# Patient Record
Sex: Male | Born: 1975 | Race: White | Hispanic: No | Marital: Single | Smoking: Current every day smoker
Health system: Southern US, Community
[De-identification: ages and names within clinical notes are randomized; demographics above are authoritative.]

## PROBLEM LIST (undated history)

## (undated) DIAGNOSIS — J45909 Unspecified asthma, uncomplicated: Secondary | ICD-10-CM

## (undated) HISTORY — PX: LEG SURGERY: SHX1003

---

## 2009-04-21 DEATH — deceased

## 2010-11-16 ENCOUNTER — Emergency Department (HOSPITAL_COMMUNITY)
Admission: EM | Admit: 2010-11-16 | Discharge: 2010-11-17 | Disposition: A | Discharge status: Home / Self Care | Payer: Self-pay | Attending: Emergency Medicine | Admitting: Emergency Medicine

## 2010-11-16 DIAGNOSIS — L2989 Other pruritus: Secondary | ICD-10-CM | POA: Insufficient documentation

## 2010-11-16 DIAGNOSIS — R070 Pain in throat: Secondary | ICD-10-CM | POA: Insufficient documentation

## 2010-11-16 DIAGNOSIS — L298 Other pruritus: Secondary | ICD-10-CM | POA: Insufficient documentation

## 2010-11-16 DIAGNOSIS — L259 Unspecified contact dermatitis, unspecified cause: Secondary | ICD-10-CM | POA: Insufficient documentation

## 2010-11-16 DIAGNOSIS — R21 Rash and other nonspecific skin eruption: Secondary | ICD-10-CM | POA: Insufficient documentation

## 2015-11-22 ENCOUNTER — Ambulatory Visit (HOSPITAL_COMMUNITY)
Admission: EM | Admit: 2015-11-22 | Discharge: 2015-11-22 | Disposition: A | Discharge status: Home / Self Care | Payer: Medicare Other | Attending: Family Medicine | Admitting: Family Medicine

## 2015-11-22 ENCOUNTER — Encounter (HOSPITAL_COMMUNITY): Payer: Self-pay | Admitting: *Deleted

## 2015-11-22 DIAGNOSIS — B86 Scabies: Secondary | ICD-10-CM | POA: Diagnosis not present

## 2015-11-22 HISTORY — DX: Unspecified asthma, uncomplicated: J45.909

## 2015-11-22 MED ORDER — PERMETHRIN 5 % EX CREA: TOPICAL_CREAM | CUTANEOUS | 1 refills | Status: DC

## 2015-11-22 NOTE — ED Triage Notes (Signed)
Pt   Reports   Symptoms  Of  Rash     X  3   Weeks        The  Rash itches   pts  Wife  And  Children are  With  Him  At this  Time     He is  Somewhat  Anxious

## 2015-11-22 NOTE — ED Provider Notes (Signed)
[facility]    CSN: [id number] Arrival date & time: 11/22/15  1746  First Provider Contact:  First MD Initiated Contact with Patient 11/22/15 1812        History   Chief Complaint Chief Complaint  Patient presents with  . Rash    HPI Brian Zhang is a 40 y.o. male.   The history is provided by the patient.  Rash  Location:  Full body Quality: itchiness   Severity:  Moderate Duration:  3 weeks Progression:  Spreading (pt extremely agitated about problem.) Chronicity:  New Context: animal contact   Context: not sick contacts   Relieved by:  None tried Worsened by:  Nothing Ineffective treatments:  None tried Associated symptoms: no abdominal pain and no fever     Past Medical History:  Diagnosis Date  . Asthma     There are no active problems to display for this patient.   History reviewed. No pertinent surgical history.     Home Medications    Prior to Admission medications   Medication Sig Start Date End Date Taking? Authorizing Provider  Atomoxetine HCl (STRATTERA PO) Take by mouth.   Yes Historical Provider, MD  DIAZEPAM PO Take by mouth.   Yes Historical Provider, MD  Venlafaxine HCl (EFFEXOR XR PO) Take by mouth.   Yes Historical Provider, MD  permethrin (ELIMITE) 5 % cream Apply tonight over body and wash off in am, may repeat in 1 week 11/22/15   Linna HoffJames D Geraldy Akridge, MD    Family History History reviewed. No pertinent family history.  Social History Social History  Substance Use Topics  . Smoking status: Current Every Day Smoker  . Smokeless tobacco: Never Used  . Alcohol use Not on file     Allergies   Review of patient's allergies indicates not on file.   Review of Systems Review of Systems  Constitutional: Negative for fever.  Gastrointestinal: Negative for abdominal pain.  Skin: Positive for rash.  All other systems reviewed and are negative.    Physical Exam Triage Vital Signs ED Triage Vitals [11/22/15 1815]  Enc  Vitals Group     BP 126/74     Pulse Rate 98     Resp 18     Temp 98.6 F (37 C)     Temp Source Oral     SpO2 100 %     Weight      Height      Head Circumference      Peak Flow      Pain Score      Pain Loc      Pain Edu?      Excl. in GC?    No data found.   Updated Vital Signs BP 126/74 (BP Location: Right Arm)   Pulse 98   Temp 98.6 F (37 C) (Oral)   Resp 18   SpO2 100%   Visual Acuity Right Eye Distance:   Left Eye Distance:   Bilateral Distance:    Right Eye Near:   Left Eye Near:    Bilateral Near:     Physical Exam  Constitutional: He appears well-developed and well-nourished. He appears distressed.  Neck: Normal range of motion. Neck supple.  Pulmonary/Chest: Effort normal and breath sounds normal.  Lymphadenopathy:    He has no cervical adenopathy.  Neurological: He is alert.  Skin: Skin is warm and dry. Rash noted.  Scattered papular rash, pt restless, agitated.  Nursing note and vitals reviewed.  UC Treatments / Results  Labs (all labs ordered are listed, but only abnormal results are displayed) Labs Reviewed - No data to display  EKG  EKG Interpretation None       Radiology No results found.  Procedures Procedures (including critical care time)  Medications Ordered in UC Medications - No data to display   Initial Impression / Assessment and Plan / UC Course  I have reviewed the triage vital signs and the nursing notes.  Pertinent labs & imaging results that were available during my care of the patient were reviewed by me and considered in my medical decision making (see chart for details).  Clinical Course      Final Clinical Impressions(s) / UC Diagnoses   Final diagnoses:  Scabies    New Prescriptions New Prescriptions   PERMETHRIN (ELIMITE) 5 % CREAM    Apply tonight over body and wash off in am, may repeat in 1 week     Linna Hoff, MD 11/22/15 [phone]

## 2016-04-13 ENCOUNTER — Emergency Department (HOSPITAL_BASED_OUTPATIENT_CLINIC_OR_DEPARTMENT_OTHER): Payer: Medicare Other

## 2016-04-13 ENCOUNTER — Encounter (HOSPITAL_BASED_OUTPATIENT_CLINIC_OR_DEPARTMENT_OTHER): Payer: Self-pay

## 2016-04-13 ENCOUNTER — Emergency Department (HOSPITAL_BASED_OUTPATIENT_CLINIC_OR_DEPARTMENT_OTHER)
Admission: EM | Admit: 2016-04-13 | Discharge: 2016-04-13 | Disposition: A | Discharge status: Home / Self Care | Payer: Medicare Other | Attending: Emergency Medicine | Admitting: Emergency Medicine

## 2016-04-13 DIAGNOSIS — M722 Plantar fascial fibromatosis: Secondary | ICD-10-CM

## 2016-04-13 DIAGNOSIS — J45909 Unspecified asthma, uncomplicated: Secondary | ICD-10-CM | POA: Insufficient documentation

## 2016-04-13 DIAGNOSIS — F17210 Nicotine dependence, cigarettes, uncomplicated: Secondary | ICD-10-CM | POA: Diagnosis not present

## 2016-04-13 DIAGNOSIS — M79672 Pain in left foot: Secondary | ICD-10-CM | POA: Diagnosis present

## 2016-04-13 MED ORDER — IBUPROFEN 800 MG PO TABS: 800.0000 mg | ORAL_TABLET | Freq: Three times a day (TID) | ORAL | 0 refills | Status: DC

## 2016-04-13 NOTE — Discharge Instructions (Signed)
Return if any problems.

## 2016-04-13 NOTE — ED Provider Notes (Signed)
[facility]   CSN: [id number] Arrival date & time: 04/13/16  1548  By signing my name below, I, Brian Zhang, attest that this documentation has been prepared under the direction and in the presence of Brian Zhang, [place]. Electronically Signed: Linna Zhang, Scribe. 04/13/2016. 8:07 PM.  History   Chief Complaint Chief Complaint  Patient presents with  . Foot Pain    The history is provided by the patient. No language interpreter was used.     HPI Comments: Brian Zhang is a 41 y.o. male who presents to the Emergency Department complaining of severe pain to the plantar surface of his left foot with applied pressure beginning a few days ago. He denies pain at rest. He states he has "amost fallen" several times when he bears weight on his left foot secondary to pain. No h/o left foot problems. He works in [employer]. He denies numbness/tingling or any other associated symptoms.  Past Medical History:  Diagnosis Date  . Asthma     There are no active problems to display for this patient.   Past Surgical History:  Procedure Laterality Date  . LEG SURGERY         Home Medications    Prior to Admission medications   Medication Sig Start Date End Date Taking? Authorizing Provider  Atomoxetine HCl (STRATTERA PO) Take by mouth.    Historical Provider, MD    Family History No family history on file.  Social History Social History  Substance Use Topics  . Smoking status: Current Every Day Smoker    Types: Cigarettes  . Smokeless tobacco: Never Used  . Alcohol use No     Allergies   Patient has no allergy information on record.   Review of Systems Review of Systems  Musculoskeletal: Positive for myalgias.  Neurological: Negative for numbness.  All other systems reviewed and are negative.    Physical Exam Updated Vital Signs BP 132/98   Pulse 99   Temp 98.2 F (36.8 C) (Oral)   Resp 18   Ht 5\' 9"  (1.753 m)   Wt 171 lb  (77.6 kg)   SpO2 100%   BMI 25.25 kg/m   Physical Exam  Constitutional: He is oriented to person, place, and time. He appears well-developed and well-nourished. No distress.  HENT:  Head: Normocephalic and atraumatic.  Eyes: Conjunctivae and EOM are normal.  Neck: Neck supple. No tracheal deviation present.  Cardiovascular: Normal rate.   Pulmonary/Chest: Effort normal. No respiratory distress.  Musculoskeletal: Normal range of motion.  Normal appearing left foot. No swelling. FROM. Dried, scaly heel.  Neurological: He is alert and oriented to person, place, and time.  Skin: Skin is warm and dry.  Psychiatric: He has a normal mood and affect. His behavior is normal.  Nursing note and vitals reviewed.    ED Treatments / Results  Labs (all labs ordered are listed, but only abnormal results are displayed) Labs Reviewed - No data to display  EKG  EKG Interpretation None       Radiology No results found.  Procedures Procedures (including critical care time)  DIAGNOSTIC STUDIES: Oxygen Saturation is 100% on RA, normal by my interpretation.    COORDINATION OF CARE: 8:12 PM Discussed treatment plan with pt at bedside and pt agreed to plan.  Medications Ordered in ED Medications - No data to display   Initial Impression / Assessment and Plan / ED Course  I have reviewed the triage vital signs and the nursing  notes.  Pertinent labs & imaging results that were available during my care of the patient were reviewed by me and considered in my medical decision making (see chart for details).     Pt counseled on exercises for plantar fascitis. Ibuprofen and heel pads.   Final Clinical Impressions(s) / ED Diagnoses   Final diagnoses:  Plantar fasciitis of left foot    New Prescriptions New Prescriptions   No medications on file   Meds ordered this encounter  Medications  . ibuprofen (ADVIL,MOTRIN) 800 MG tablet    Sig: Take 1 tablet (800 mg total) by mouth 3  (three) times daily.    Dispense:  21 tablet    Refill:  0    Order Specific Question:   Supervising Provider    Answer:   Brian Zhang, BRIAN [[id number]]  An After Visit Summary was printed and given to the patient. I personally performed the services in this documentation, which was scribed in my presence.  The recorded information has been reviewed and considered.   Brian PallKaren SofiaPAC.   Brian SkinnerLeslie K OrmsbySofia, PA-C 04/14/16 [id number]    Brian Raceravid Yelverton, MD 04/15/16 [phone]

## 2016-04-13 NOTE — ED Triage Notes (Signed)
C/o pain to left foot/heel x 6 days-denies injury-NAD-presents to triage in w/c

## 2016-08-14 ENCOUNTER — Encounter (HOSPITAL_COMMUNITY): Payer: Self-pay

## 2016-08-14 ENCOUNTER — Emergency Department (HOSPITAL_COMMUNITY)
Admission: EM | Admit: 2016-08-14 | Discharge: 2016-08-14 | Disposition: A | Discharge status: Home / Self Care | Payer: Medicare Other | Attending: Emergency Medicine | Admitting: Emergency Medicine

## 2016-08-14 DIAGNOSIS — S30860A Insect bite (nonvenomous) of lower back and pelvis, initial encounter: Secondary | ICD-10-CM | POA: Insufficient documentation

## 2016-08-14 DIAGNOSIS — J45909 Unspecified asthma, uncomplicated: Secondary | ICD-10-CM | POA: Diagnosis not present

## 2016-08-14 DIAGNOSIS — Y9261 Building [any] under construction as the place of occurrence of the external cause: Secondary | ICD-10-CM | POA: Insufficient documentation

## 2016-08-14 DIAGNOSIS — W57XXXA Bitten or stung by nonvenomous insect and other nonvenomous arthropods, initial encounter: Secondary | ICD-10-CM | POA: Diagnosis not present

## 2016-08-14 DIAGNOSIS — S20369A Insect bite (nonvenomous) of unspecified front wall of thorax, initial encounter: Secondary | ICD-10-CM | POA: Insufficient documentation

## 2016-08-14 DIAGNOSIS — Y939 Activity, unspecified: Secondary | ICD-10-CM | POA: Insufficient documentation

## 2016-08-14 DIAGNOSIS — Z79899 Other long term (current) drug therapy: Secondary | ICD-10-CM | POA: Insufficient documentation

## 2016-08-14 DIAGNOSIS — F17210 Nicotine dependence, cigarettes, uncomplicated: Secondary | ICD-10-CM | POA: Diagnosis not present

## 2016-08-14 DIAGNOSIS — S80862A Insect bite (nonvenomous), left lower leg, initial encounter: Secondary | ICD-10-CM | POA: Insufficient documentation

## 2016-08-14 DIAGNOSIS — Y990 Civilian activity done for income or pay: Secondary | ICD-10-CM | POA: Insufficient documentation

## 2016-08-14 MED ORDER — IVERMECTIN 3 MG PO TABS: 200.0000 ug/kg | ORAL_TABLET | Freq: Once | ORAL | 0 refills | Status: AC

## 2016-08-14 MED ORDER — HYDROCORTISONE 1 % EX CREA: TOPICAL_CREAM | CUTANEOUS | 0 refills | Status: AC

## 2016-08-14 MED ORDER — PERMETHRIN 5 % EX CREA: TOPICAL_CREAM | CUTANEOUS | 0 refills | Status: AC

## 2016-08-14 NOTE — ED Provider Notes (Signed)
[facility]   CSN: [id number] Arrival date & time: 08/14/16  1531     History   Chief Complaint Chief Complaint  Patient presents with  . bug bites    HPI Brian Zhang is a 41 y.o. male who presents with 3 weeks of of pruritic bites to generalized body. 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 Patient reports that since then he has visualized small black bugs on various parts of his body and has had resulting bites. He reports that he has tried multiple over-the-counter topical creams. He is tried multiple treatments at his house to get rid of the bugs. He denies any improvement of symptoms with these treatments. He reports that despite treatments bites and itching have persisted. Comes today because the itching and discomfort has gotten so severe that he had difficulty sleeping.Patient states that his wife and daughter who live in the same house have not had the same symptoms. He denies any fever, recent illness, difficulty breathing.   The history is provided by the patient.    Past Medical History:  Diagnosis Date  . Asthma     There are no active problems to display for this patient.   Past Surgical History:  Procedure Laterality Date  . LEG SURGERY         Home Medications    Prior to Admission medications   Medication Sig Start Date End Date Taking? Authorizing Provider  Atomoxetine HCl (STRATTERA PO) Take by mouth.    [provider]  hydrocortisone cream 1 % Apply to affected area 2 times daily 08/14/16   Graciella FreerLayden, Lindsey A, PA-C  ibuprofen (ADVIL,MOTRIN) 800 MG tablet Take 1 tablet (800 mg total) by mouth 3 (three) times daily. 04/13/16   Elson AreasSofia, Leslie K, PA-C  permethrin (ELIMITE) 5 % cream Apply to affected area once 08/14/16   Maxwell CaulLayden, Lindsey A, PA-C    Family History No family history on file.  Social History Social History  Substance Use Topics    . Smoking status: Current Every Day Smoker    Types: Cigarettes  . Smokeless tobacco: Never Used  . Alcohol use No     Allergies   Patient has no allergy information on record.   Review of Systems Review of Systems  Constitutional: Negative for fever.  Skin: Positive for rash.     Physical Exam Updated Vital Signs BP (!) 144/68 (BP Location: Left Arm)   Pulse 89   Temp 98 F (36.7 C) (Oral)   Resp 16   SpO2 100%   Physical Exam  Constitutional: He appears well-developed and well-nourished.  Sitting comfortably on examination table  HENT:  Head: Normocephalic and atraumatic.  No bugs visualized in the hairline.  Eyes: Conjunctivae and EOM are normal. Right eye exhibits no discharge. Left eye exhibits no discharge. No scleral icterus.  Pulmonary/Chest: Effort normal.  Neurological: He is alert.  Skin: Skin is warm and dry. Capillary refill takes less than 2 seconds.  Diffuse, scattered, small erythematous bites to 2 back anterior chest groin anterior aspect of legs. He has evidence of burrow lines to the lateral aspect of the left third digit, left anterior leg, left distal leg. No rash noted to the palms of hands or soles of feet.   Psychiatric: He has a normal mood and affect. His speech is normal and behavior is normal.  Nursing note and vitals reviewed.    ED  Treatments / Results  Labs (all labs ordered are listed, but only abnormal results are displayed) Labs Reviewed - No data to display  EKG  EKG Interpretation None       Radiology No results found.  Procedures Procedures (including critical care time)  Medications Ordered in ED Medications - No data to display   Initial Impression / Assessment and Plan / ED Course  I have reviewed the triage vital signs and the nursing notes.  Pertinent labs & imaging results that were available during my care of the patient were reviewed by me and considered in my medical decision making (see chart for  details).     41 year old male who presents with 3 weeks of itching bites. Patient brought in a toy from the home that has the bugs on it. They appear to be a small mites. He is tried multiple over-the-counter treatments with no relief of symptoms. Physical exam shows diffusely scattered small erythematous bites to the back chest, legs. He does have some burrow lines that are concerning for scabies.  Will plan to treat with permethrin for potential scabies infection. Discussed plan with patient. Wife is asking to try ivermectin since they've tried anything did not have any improvement and this is been going on for so long. Wife also asking for some steroid seemed to help with the itching. Wife also wants an ID referral for further evaluation and potential treatment. Given the patient's symptoms have been going on for so long and he does have evidence of bug, we'll plan to give a treatment of ivermectin. Discussed with patient. Strict return precautions discussed. Patient was understanding and agreement to plan.  Final Clinical Impressions(s) / ED Diagnoses   Final diagnoses:  Bug bite, initial encounter    New Prescriptions Discharge Medication List as of 08/14/2016  8:24 PM    START taking these medications   Details  hydrocortisone cream 1 % Apply to affected area 2 times daily, Print    ivermectin (STROMECTOL) 3 MG TABS tablet Take 5 tablets (15,000 mcg total) by mouth once., Starting Sun 08/14/2016, Print    permethrin (ELIMITE) 5 % cream Apply to affected area once, Print         Rosana Hoes 08/15/16 0139    Tilden Fossa, MD 08/16/16 1435

## 2016-08-14 NOTE — Discharge Instructions (Signed)
Use the Permethrin cream as directed.   You can apply the hydrocortisone as needed for symptomatic relief.   Take 5 tablets of the Ivermectin at one time. In 2 weeks, take 5 more tables at one time.   Follow-up with referred ID doctor for further evaluation as needed.   Follow-up with your primary care doctor in the next 24-48 hours.   Return to the Emergency Dept for any worsening symptoms, fever, spreading redness or any other worsening or concerning symptoms.

## 2016-08-14 NOTE — ED Triage Notes (Signed)
Patient here with itchy skin for weeks after working on restoring old house. Patient thinks it is bird mites. Denies pain but states that the crawling feeling on skin keeps him awake at night

## 2016-08-14 NOTE — ED Notes (Signed)
Pt  C/o bug bites all over body.  C/o itching.  Pt has several plastic bags with very small black bugs in it.  Pt also has a small white stuffed animal that belongs to his child in a plastic bag that has black bugs attached to it.

## 2016-09-12 ENCOUNTER — Emergency Department (HOSPITAL_COMMUNITY): Payer: Medicare Other

## 2016-09-12 ENCOUNTER — Encounter (HOSPITAL_COMMUNITY): Payer: Self-pay

## 2016-09-12 ENCOUNTER — Emergency Department (HOSPITAL_COMMUNITY)
Admission: EM | Admit: 2016-09-12 | Discharge: 2016-09-13 | Disposition: A | Discharge status: Home / Self Care | Payer: Medicare Other | Attending: Emergency Medicine | Admitting: Emergency Medicine

## 2016-09-12 DIAGNOSIS — Z79899 Other long term (current) drug therapy: Secondary | ICD-10-CM | POA: Diagnosis not present

## 2016-09-12 DIAGNOSIS — Y9389 Activity, other specified: Secondary | ICD-10-CM | POA: Insufficient documentation

## 2016-09-12 DIAGNOSIS — S59912A Unspecified injury of left forearm, initial encounter: Secondary | ICD-10-CM | POA: Diagnosis present

## 2016-09-12 DIAGNOSIS — Y990 Civilian activity done for income or pay: Secondary | ICD-10-CM | POA: Insufficient documentation

## 2016-09-12 DIAGNOSIS — W208XXA Other cause of strike by thrown, projected or falling object, initial encounter: Secondary | ICD-10-CM | POA: Diagnosis not present

## 2016-09-12 DIAGNOSIS — Y929 Unspecified place or not applicable: Secondary | ICD-10-CM | POA: Insufficient documentation

## 2016-09-12 DIAGNOSIS — J45909 Unspecified asthma, uncomplicated: Secondary | ICD-10-CM | POA: Diagnosis not present

## 2016-09-12 DIAGNOSIS — S5012XA Contusion of left forearm, initial encounter: Secondary | ICD-10-CM | POA: Insufficient documentation

## 2016-09-12 DIAGNOSIS — F17210 Nicotine dependence, cigarettes, uncomplicated: Secondary | ICD-10-CM | POA: Diagnosis not present

## 2016-09-12 NOTE — ED Triage Notes (Signed)
Pt reports pain to left anterior forearm and bruising secondary to injury while at work. He states tile fell onto his arm.

## 2016-09-13 MED ORDER — IBUPROFEN 800 MG PO TABS: 800.0000 mg | ORAL_TABLET | Freq: Three times a day (TID) | ORAL | 0 refills | Status: AC

## 2016-09-13 NOTE — ED Provider Notes (Signed)
[facility]   CSN: [id number] Arrival date & time: 09/12/16  2056     History   Chief Complaint Chief Complaint  Patient presents with  . Arm Injury    HPI Brian Zhang is a 41 y.o. male.  41 year old male presents for left forearm pain after dropping a box of tiles on his arm at work this afternoon. Patient reports constant pain. He noted some swelling initially which has mildly improved. He took Tylenol prior to arrival without relief. He is concern for fracture.   The history is provided by the patient. No language interpreter was used.  Arm Injury   This is a new problem. The current episode started 6 to 12 hours ago. The problem occurs constantly. The problem has not changed since onset.The pain is present in the left arm. The pain is mild. Treatments tried: acetaminophen. The treatment provided no relief.    Past Medical History:  Diagnosis Date  . Asthma     There are no active problems to display for this patient.   Past Surgical History:  Procedure Laterality Date  . LEG SURGERY       Home Medications    Prior to Admission medications   Medication Sig Start Date End Date Taking? Authorizing Provider  Atomoxetine HCl (STRATTERA PO) Take by mouth.    [provider]  hydrocortisone cream 1 % Apply to affected area 2 times daily 08/14/16   Graciella FreerLayden, Lindsey A, PA-C  ibuprofen (ADVIL,MOTRIN) 800 MG tablet Take 1 tablet (800 mg total) by mouth 3 (three) times daily. 09/13/16   Antony MaduraHumes, Jes Costales, PA-C  permethrin (ELIMITE) 5 % cream Apply to affected area once 08/14/16   Maxwell CaulLayden, Lindsey A, PA-C    Family History No family history on file.  Social History Social History  Substance Use Topics  . Smoking status: Current Every Day Smoker    Types: Cigarettes  . Smokeless tobacco: Never Used  . Alcohol use No     Allergies   Patient has no allergy information on record.   Review of Systems Review of Systems Ten systems reviewed  and are negative for acute change, except as noted in the HPI.    Physical Exam Updated Vital Signs BP (!) 131/96 (BP Location: Left Arm)   Pulse (!) 101   Temp 98.7 F (37.1 C) (Oral)   Resp 17   SpO2 98%   Physical Exam  Constitutional: He is oriented to person, place, and time. He appears well-developed and well-nourished. No distress.  Nontoxic appearing in no acute distress  HENT:  Head: Normocephalic and atraumatic.  Eyes: Conjunctivae and EOM are normal. No scleral icterus.  Neck: Normal range of motion.  Cardiovascular: Normal rate, regular rhythm and intact distal pulses.   Distal radial pulse 2+ in the left upper extremity. Capillary refill brisk in all digits of left hand  Pulmonary/Chest: Effort normal. No respiratory distress. He has no wheezes.  Respirations even and unlabored  Musculoskeletal: Normal range of motion.  Normal range of motion of the left forearm. There is mild ecchymosis to the volar, radial aspect of the forearm. Compartments soft. No crepitus or deformity.  Neurological: He is alert and oriented to person, place, and time. He exhibits normal muscle tone. Coordination normal.  Sensation to light touch intact in the left upper extremity. Grip strength 5/5.  Skin: Skin is warm and dry. No rash noted. He is not diaphoretic. No erythema. No pallor.  Psychiatric: He has a normal mood  and affect. His behavior is normal.  Nursing note and vitals reviewed.    ED Treatments / Results  Labs (all labs ordered are listed, but only abnormal results are displayed) Labs Reviewed - No data to display  EKG  EKG Interpretation None       Radiology Dg Forearm Left  Result Date: 09/12/2016 CLINICAL DATA:  Pain to the anterior forearm with bruising EXAM: LEFT FOREARM - 2 VIEW COMPARISON:  None. FINDINGS: There is no evidence of fracture or other focal bone lesions. Soft tissues are unremarkable. IMPRESSION: Negative. Electronically Signed   By: Jasmine Pang  M.D.   On: 09/12/2016 21:21    Procedures Procedures (including critical care time)  Medications Ordered in ED Medications - No data to display   Initial Impression / Assessment and Plan / ED Course  I have reviewed the triage vital signs and the nursing notes.  Pertinent labs & imaging results that were available during my care of the patient were reviewed by me and considered in my medical decision making (see chart for details).     41 year old male presents for left arm pain after dropping a box of tiles on his forearm at work. He is neurovascularly intact. No bony deformity or crepitus. X-ray negative for fracture. Symptoms consistent with contusion. Will manage supportively and referred to primary care for follow-up. Return precautions discussed and provided. Patient discharged in stable condition with no unaddressed concerns.   Final Clinical Impressions(s) / ED Diagnoses   Final diagnoses:  Contusion of left forearm, initial encounter    New Prescriptions Current Discharge Medication List       Antony Madura, Cordelia Poche 09/13/16 4098    Shaune Pollack, MD 09/13/16 [phone]

## 2019-05-30 IMAGING — CR DG FOREARM 2V*L*
2 series · 2 of 2 positions shown · non-contrast
Comparison: None.

CLINICAL DATA: Pain to the anterior forearm with bruising

EXAM:
LEFT FOREARM - 2 VIEW

[forearm ap]
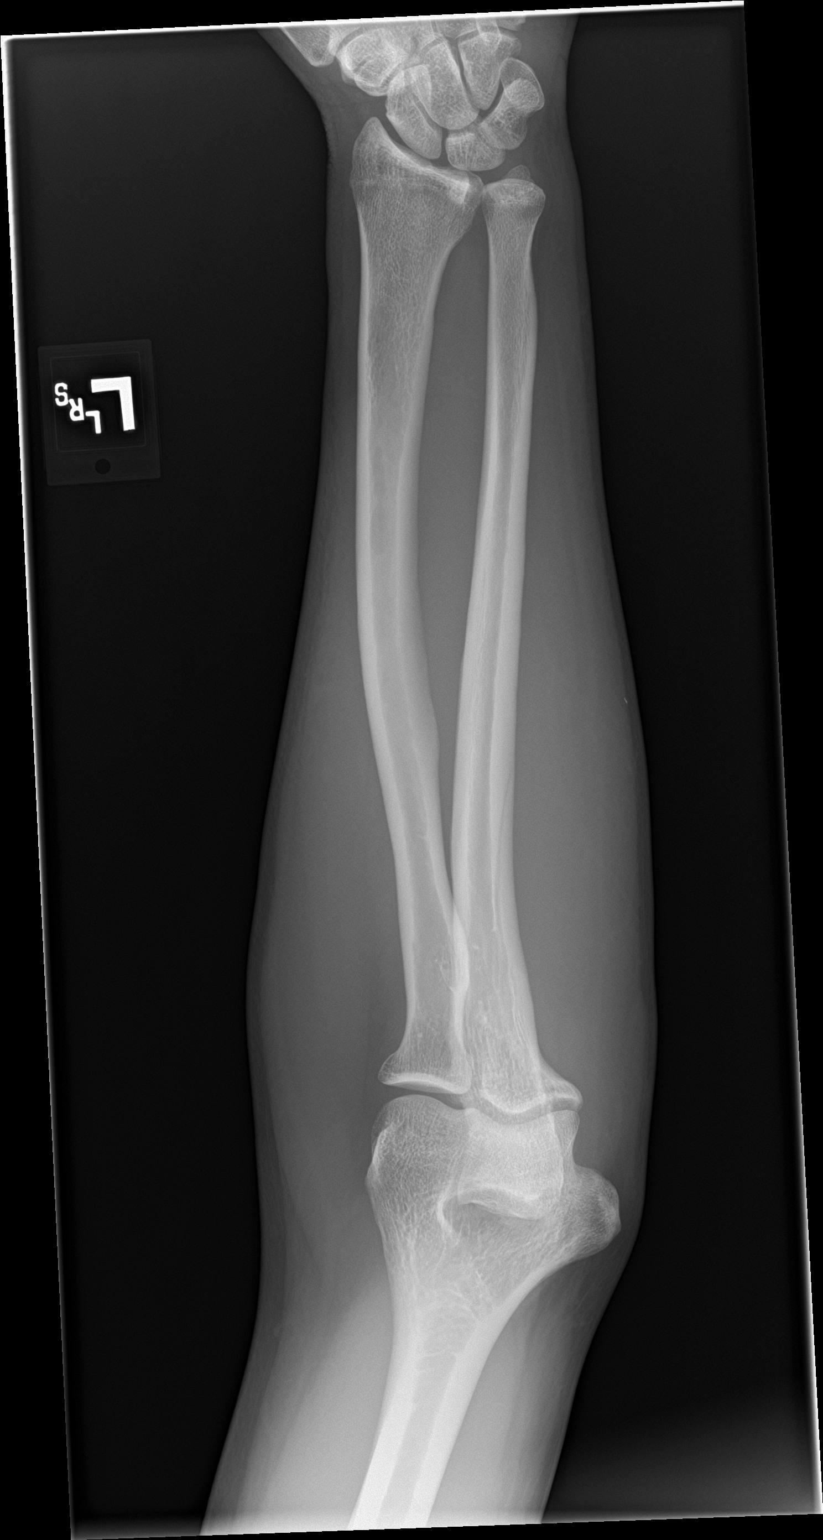

[forearm lat]
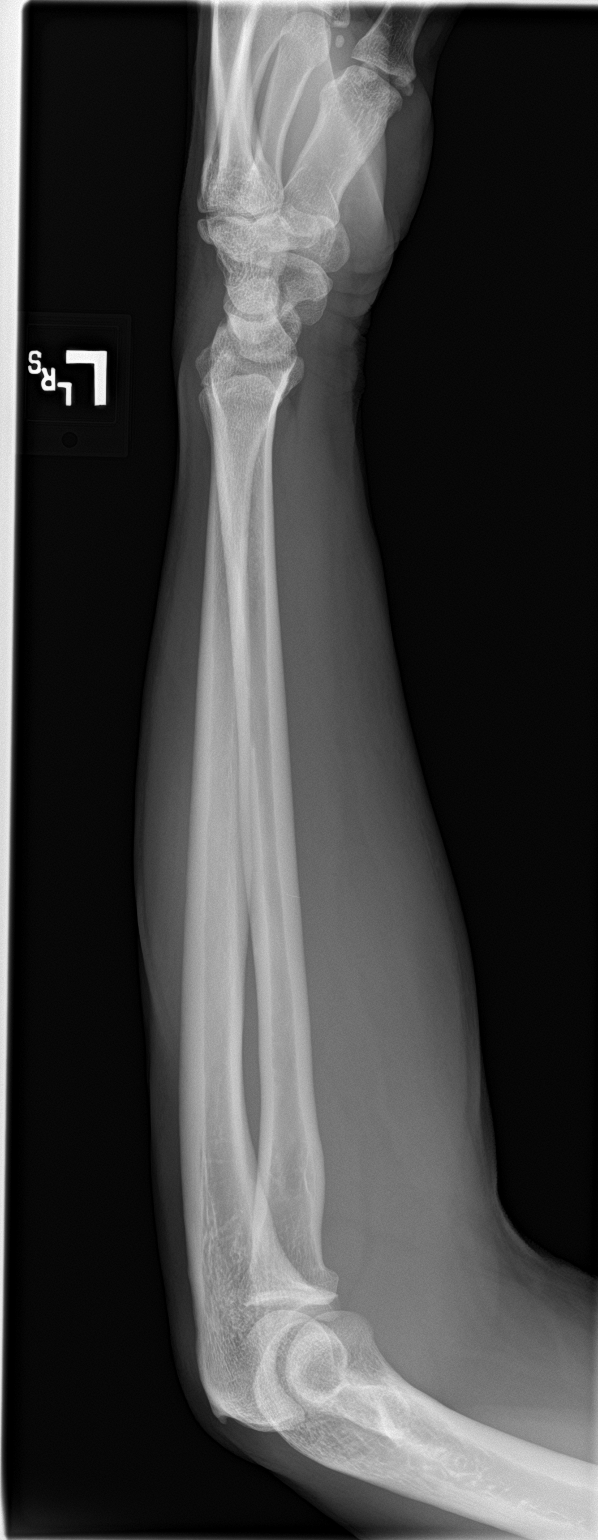

[2 of 2 positions shown; findings below may reference images not displayed]

FINDINGS: There is no evidence of fracture or other focal bone lesions. Soft
tissues are unremarkable.
IMPRESSION: Negative.
